# Patient Record
Sex: Male | Born: 1965 | Race: Black or African American | Hispanic: No | Marital: Single | Smoking: Former smoker
Health system: Southern US, Community
[De-identification: ages and names within clinical notes are randomized; demographics above are authoritative.]

---

## 2004-04-30 ENCOUNTER — Emergency Department (HOSPITAL_COMMUNITY): Admission: EM | Admit: 2004-04-30 | Discharge: 2004-04-30 | Payer: Self-pay | Admitting: Emergency Medicine
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
## 2009-05-22 ENCOUNTER — Emergency Department (HOSPITAL_COMMUNITY): Admission: EM | Admit: 2009-05-22 | Discharge: 2009-05-22 | Payer: Self-pay | Admitting: Family Medicine

## 2011-05-07 IMAGING — CR DG CHEST 2V
2 series · 2 of 2 positions shown · non-contrast
Comparison: None

CLINICAL DATA: Chest pain

CHEST - 2 VIEW

[view not recorded (1 of 2)]
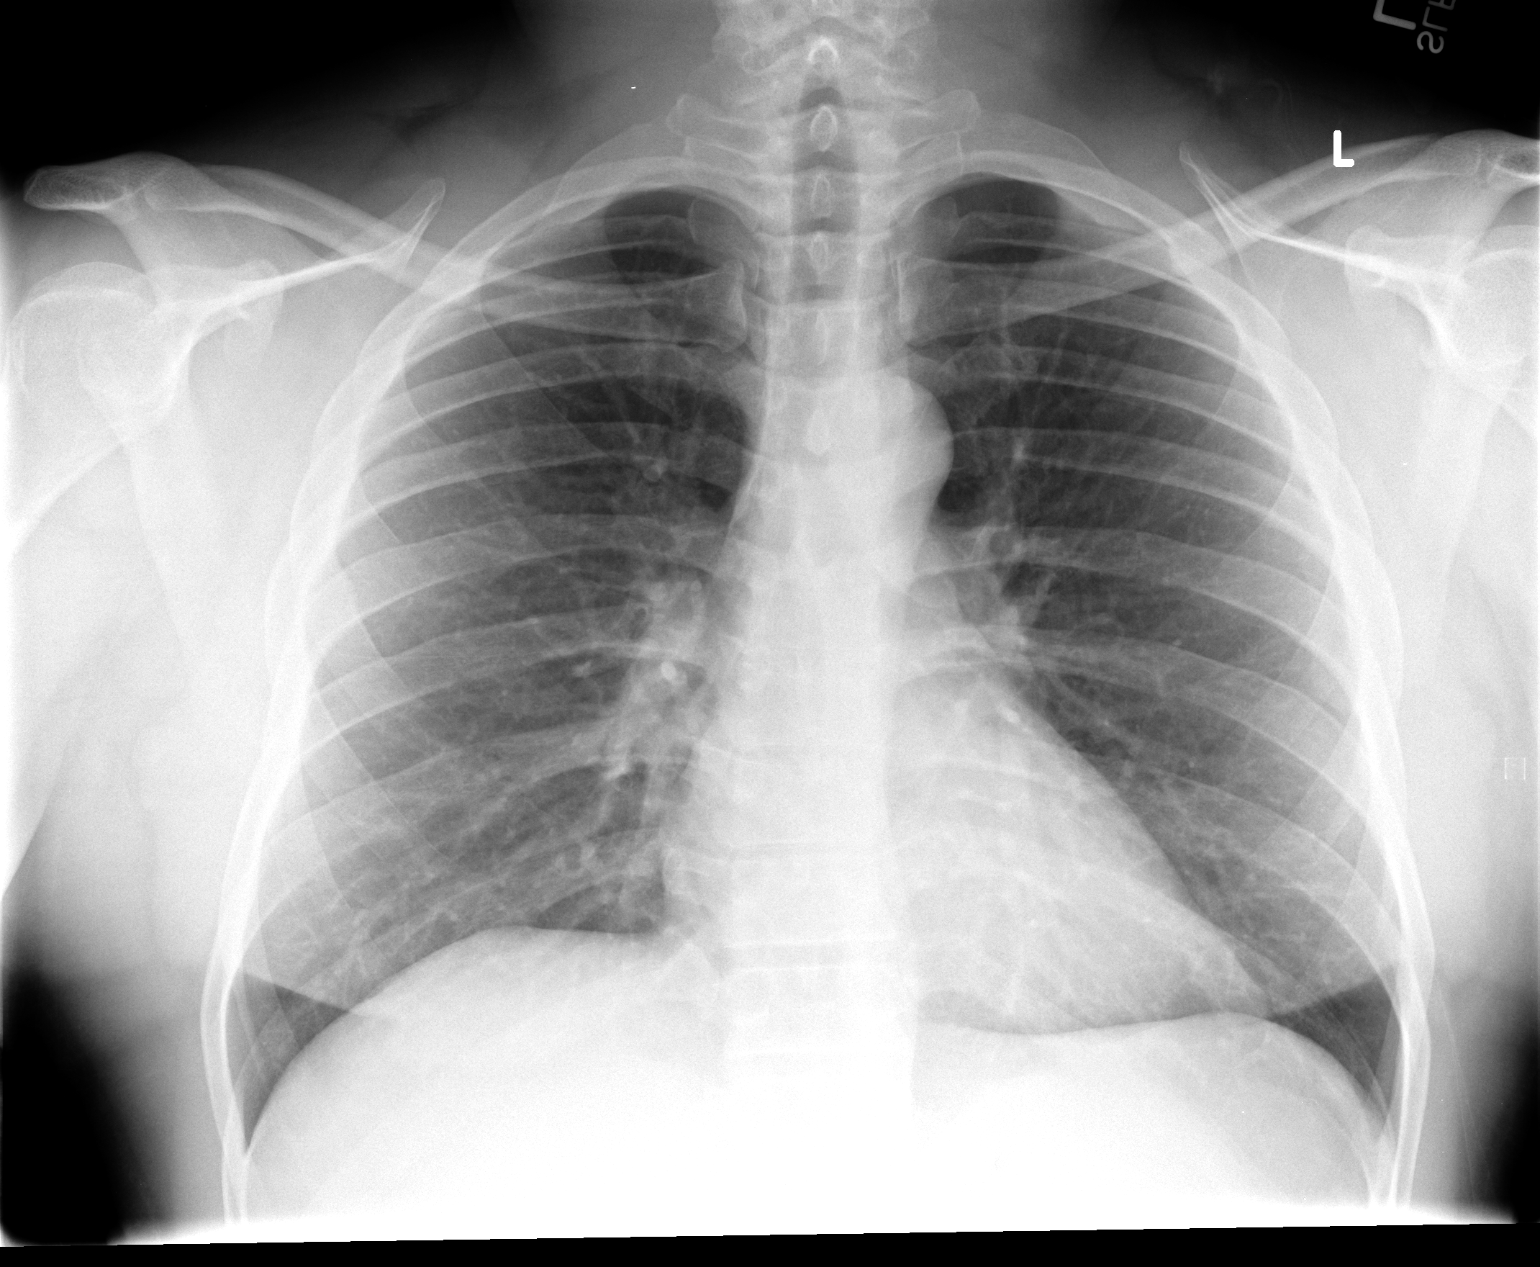

[view not recorded (2 of 2)]
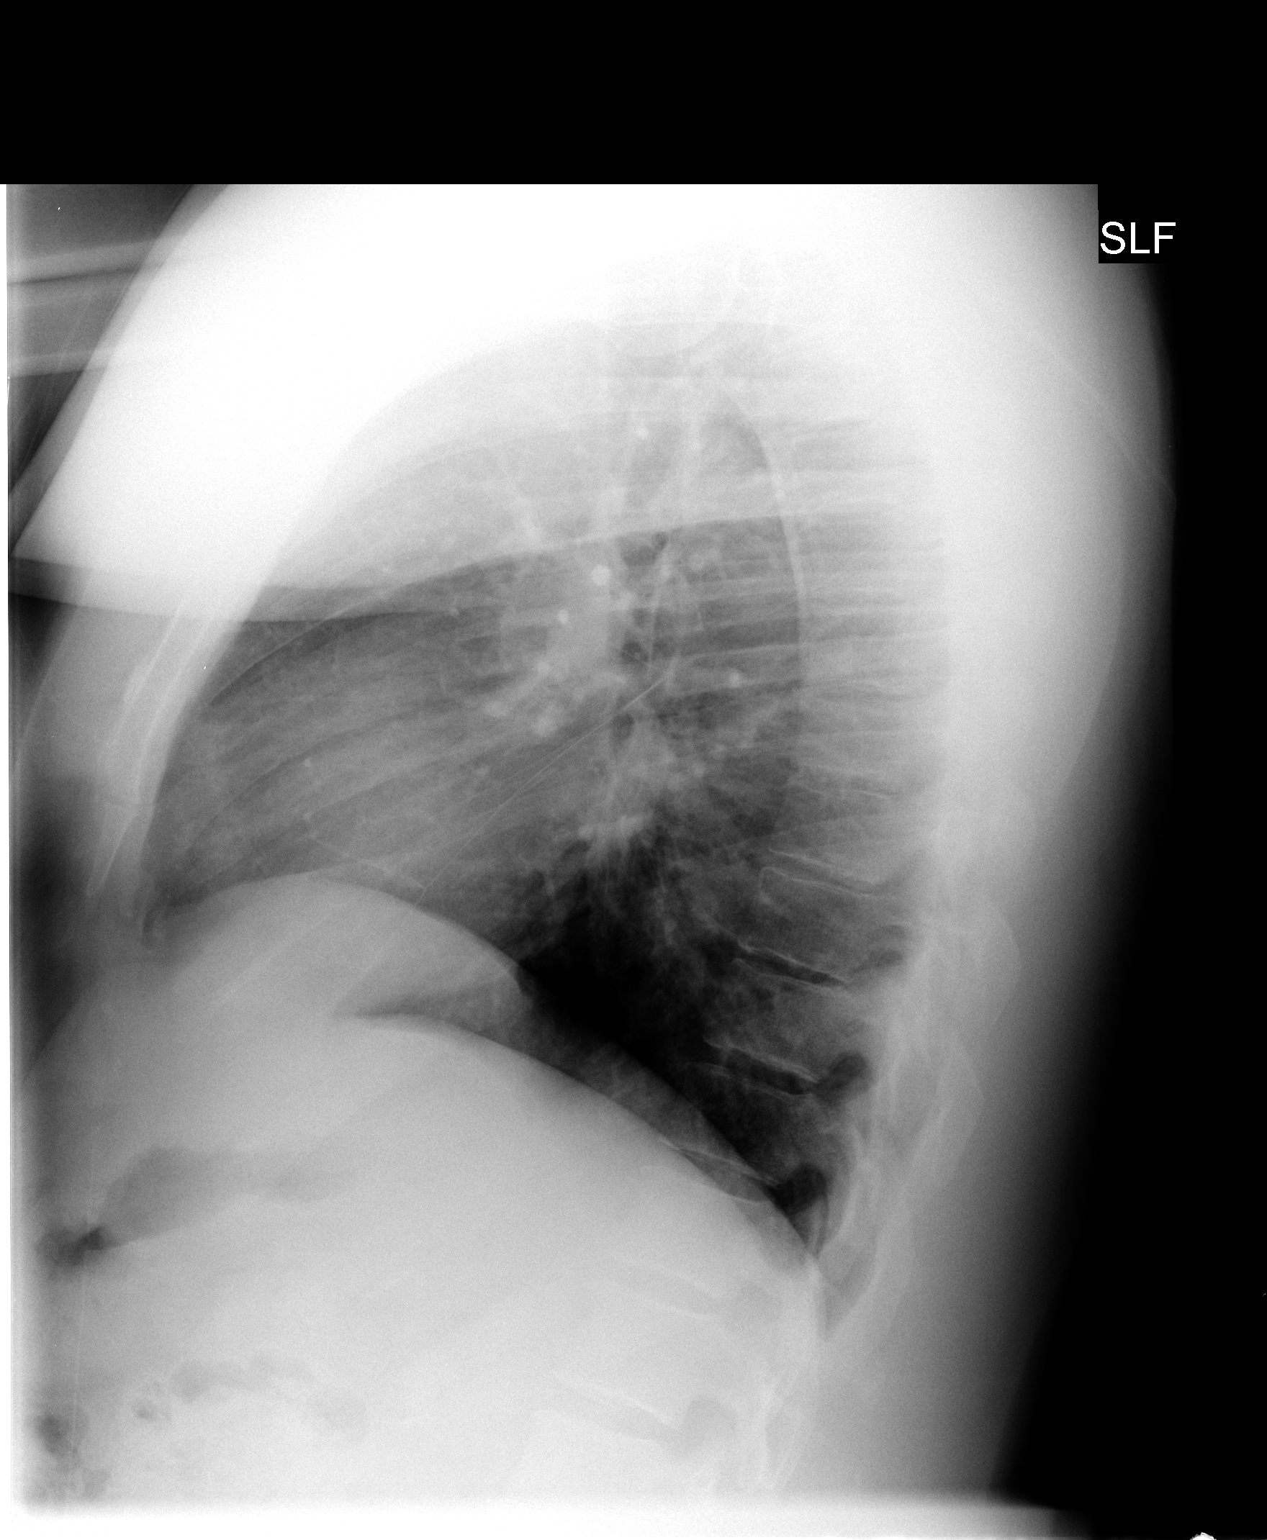

[2 of 2 positions shown; findings below may reference images not displayed]

FINDINGS: Heart size is normal.  The mediastinum is unremarkable.
Lungs are clear.  No effusions.  No bony abnormalities.
IMPRESSION: Normal chest

## 2011-06-02 ENCOUNTER — Ambulatory Visit (INDEPENDENT_AMBULATORY_CARE_PROVIDER_SITE_OTHER): Payer: Managed Care, Other (non HMO) | Admitting: Internal Medicine

## 2011-06-02 VITALS — BP 106/66 | HR 59 | Temp 97.9°F | Resp 16 | Ht 71.5 in | Wt 218.4 lb

## 2011-06-02 DIAGNOSIS — J029 Acute pharyngitis, unspecified: Secondary | ICD-10-CM

## 2011-06-02 LAB — POCT CBC
Granulocyte percent: 32.1 %G — AB (ref 37–80)
HCT, POC: 42.6 % — AB (ref 43.5–53.7)
Hemoglobin: 13.4 g/dL — AB (ref 14.1–18.1)
MPV: 7.5 fL (ref 0–99.8)
POC Granulocyte: 1.1 — AB (ref 2–6.9)
POC MID %: 6.5 %M (ref 0–12)
RBC: 4.9 M/uL (ref 4.69–6.13)

## 2011-06-02 LAB — POCT RAPID STREP A (OFFICE): Rapid Strep A Screen: NEGATIVE

## 2011-06-02 MED ORDER — PREDNISONE 20 MG PO TABS: ORAL_TABLET | ORAL | Status: DC

## 2011-06-02 NOTE — Patient Instructions (Signed)
Pharyngitis:  Take all of prednisone and avoid irritating smoke.  RTC if not improved in 1 week.  Pharyngitis, Viral and Bacterial Pharyngitis is soreness (inflammation) or infection of the pharynx. It is also called a sore throat. CAUSES  Most sore throats are caused by viruses and are part of a cold. However, some sore throats are caused by strep and other bacteria. Sore throats can also be caused by post nasal drip from draining sinuses, allergies and sometimes from sleeping with an open mouth. Infectious sore throats can be spread from person to person by coughing, sneezing and sharing cups or eating utensils. TREATMENT  Sore throats that are viral usually last 3-4 days. Viral illness will get better without medications (antibiotics). Strep throat and other bacterial infections will usually begin to get better about 24-48 hours after you begin to take antibiotics. HOME CARE INSTRUCTIONS   If the caregiver feels there is a bacterial infection or if there is a positive strep test, they will prescribe an antibiotic. The full course of antibiotics must be taken. If the full course of antibiotic is not taken, you or your child may become ill again. If you or your child has strep throat and do not finish all of the medication, serious heart or kidney diseases may develop.   Drink enough water and fluids to keep your urine clear or pale yellow.   Only take over-the-counter or prescription medicines for pain, discomfort or fever as directed by your caregiver.   Get lots of rest.   Gargle with salt water ( tsp. of salt in a glass of water) as often as every 1-2 hours as you need for comfort.   Hard candies may soothe the throat if individual is not at risk for choking. Throat sprays or lozenges may also be used.  SEEK MEDICAL CARE IF:   Large, tender lumps in the neck develop.   A rash develops.   Green, yellow-brown or bloody sputum is coughed up.   Your baby is older than 3 months with a  rectal temperature of 100.5 F (38.1 C) or higher for more than 1 day.  SEEK IMMEDIATE MEDICAL CARE IF:   A stiff neck develops.   You or your child are drooling or unable to swallow liquids.   You or your child are vomiting, unable to keep medications or liquids down.   You or your child has severe pain, unrelieved with recommended medications.   You or your child are having difficulty breathing (not due to stuffy nose).   You or your child are unable to fully open your mouth.   You or your child develop redness, swelling, or severe pain anywhere on the neck.   You have a fever.   Your baby is older than 3 months with a rectal temperature of 102 F (38.9 C) or higher.   Your baby is 14 months old or younger with a rectal temperature of 100.4 F (38 C) or higher.  MAKE SURE YOU:   Understand these instructions.   Will watch your condition.   Will get help right away if you are not doing well or get worse.  Document Released: 03/25/2005 Document Revised: 12/05/2010 Document Reviewed: 06/22/2007 [facility] Patient Information 2012 [place], [place].

## 2011-06-02 NOTE — Progress Notes (Signed)
  Subjective:    Patient ID: Ryan Ho, male    DOB: [birth year], 46 y.o.   MRN: [id number]  Sore Throat  This is a new problem. The current episode started in the past 7 days. The problem has been unchanged. Neither side of throat is experiencing more pain than the other. There has been no fever. Associated symptoms include coughing. Pertinent negatives include no congestion, ear pain or neck pain. He has tried nothing for the symptoms.  Cough This is a new problem. The current episode started in the past 7 days. The problem has been unchanged. The problem occurs every few hours. The cough is productive of sputum. Associated symptoms include a sore throat. Pertinent negatives include no chest pain, chills, ear congestion, ear pain, postnasal drip, rhinorrhea or sweats. He has tried nothing for the symptoms. There is no history of asthma, bronchitis, environmental allergies or pneumonia.   His symptoms began 4 days ago when his respirator at work seemed to malfunction, he worked most of the day with it and had to keep removing it and trying to clear it.  He had a productive cough at that time.  He was given a new respirator later that day and has been able to work everyday.  He denies a past history of lung problems or coughing.   Review of Systems  Constitutional: Negative.  Negative for chills.  HENT: Positive for sore throat. Negative for ear pain, nosebleeds, congestion, rhinorrhea, neck pain and postnasal drip.   Eyes: Negative.   Respiratory: Positive for cough.   Cardiovascular: Negative for chest pain.  Genitourinary: Negative.   Skin: Negative.   Neurological: Negative.   Hematological: Negative for environmental allergies.  Psychiatric/Behavioral: Negative.        Objective:   Physical Exam  Vitals reviewed. Constitutional: He appears well-developed and well-nourished.  HENT:  Head: Normocephalic.  Right Ear: External ear normal.  Left Ear: External ear normal.    Mouth/Throat: No oropharyngeal exudate.  Eyes: Conjunctivae are normal.  Neck: Neck supple.  Cardiovascular: Normal rate, regular rhythm and normal heart sounds.   Pulmonary/Chest: Effort normal. No respiratory distress. He has no wheezes. He has no rales. He exhibits no tenderness.  Lymphadenopathy:    He has no cervical adenopathy.          Assessment & Plan:  Pharyngitis, likely due to irritant inhalation.  He is clinically stable and I do not think a CxR is needed today.  If he doesn't improve after his short course of oral prednisone he is to RTC in 5 days, sooner if worse.  Pt agrees with this plan.

## 2016-04-19 ENCOUNTER — Ambulatory Visit: Payer: BLUE CROSS/BLUE SHIELD | Admitting: Psychology

## 2016-11-01 ENCOUNTER — Ambulatory Visit (INDEPENDENT_AMBULATORY_CARE_PROVIDER_SITE_OTHER): Payer: BLUE CROSS/BLUE SHIELD | Admitting: Family Medicine

## 2016-11-01 ENCOUNTER — Encounter: Payer: Self-pay | Admitting: Family Medicine

## 2016-11-01 VITALS — BP 104/68 | HR 66 | Temp 98.2°F | Resp 16 | Ht 71.0 in | Wt 217.0 lb

## 2016-11-01 DIAGNOSIS — M2142 Flat foot [pes planus] (acquired), left foot: Secondary | ICD-10-CM | POA: Diagnosis not present

## 2016-11-01 DIAGNOSIS — M79672 Pain in left foot: Secondary | ICD-10-CM

## 2016-11-01 DIAGNOSIS — M2010 Hallux valgus (acquired), unspecified foot: Secondary | ICD-10-CM

## 2016-11-01 DIAGNOSIS — M2141 Flat foot [pes planus] (acquired), right foot: Secondary | ICD-10-CM

## 2016-11-01 NOTE — Progress Notes (Signed)
Subjective:  By signing my name below, I, Ryan Ho, attest that this documentation has been prepared under the direction and in the presence of Ryan FloodJeffrey R Zyren Sevigny, MD Electronically Signed: Charline BillsEssence Ho, ED Scribe 11/01/2016 at 11:34 AM.   Patient ID: Ryan Ho, male    DOB: 14-May-1965, 51 y.o.   MRN: [id number]  Chief Complaint  Patient presents with  . Foot Pain    left foot x2 months denies injury   HPI  Ryan CobbMichael Ho is a 51 y.o. male who presents to [facility] Care at [facility] complaining of intermittent left foot pain x 2 months. 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 No known injury, exercise, or new activities. Pt runs 8-9 miles/week on the elliptical and track but states this is unchanged. No change in mild recently. He reports increased left foot pain at the end of the day and with applying pressure to the area . No treatments tried PTA. Pt denies swelling to the area. No h/o gout.  Pain comes and goes in that area of the foot. Right foot has not been hurting. No attempted treatments. He is able to still run.   There are no active problems to display for this patient.  History reviewed. No pertinent past medical history. History reviewed. No pertinent surgical history. No Known Allergies Prior to Admission medications   Medication Sig Start Date End Date Taking? Authorizing Provider  predniSONE (DELTASONE) 20 MG tablet Take three tabs once daily for 2 days, then two tabs daily for two days, then one tab daily for two days. 06/02/11   Rickard PatienceNelson, Ryan E, PA-C   Social History   Social History  . Marital status: Single    Spouse name: N/A  . Number of children: N/A  . Years of education: N/A   Occupational History  . Not on file.   Social History Main Topics  . Smoking status: Former Smoker    Quit date: 06/02/1991  . Smokeless tobacco: Never Used  . Alcohol use No  . Drug use: No  . Sexual activity: Not on file   Other  Topics Concern  . Not on file   Social History Narrative  . No narrative on file   Review of Systems  Musculoskeletal: Positive for arthralgias. Negative for joint swelling.      Objective:   Physical Exam  Constitutional: He is oriented to person, place, and time. He appears well-developed and well-nourished. No distress.  HENT:  Head: Normocephalic and atraumatic.  Eyes: Conjunctivae and EOM are normal.  Neck: Neck supple. No tracheal deviation present.  Cardiovascular: Normal rate.   Pulmonary/Chest: Effort normal. No respiratory distress.  Musculoskeletal: Normal range of motion.  Skin is intact. No erythema. No rash. Pain-free ankle range of motion, no bony tenderness of ankle malleoli. Plantar fascia non-tender. 5th metatarsal is non-tender. Navicular is non-tender. Describes area of discomfort at 1st MTP where there is a sligh bunion/ hallux valgus. Similar appearing area on R foot but not as prominent. Foot assessment: marked foot pes planus bilaterally with collapse of longitudinal arch, slight collapse of transverse arch with outward rotation of 5th toe. Normal inversion on heel lift/toe stance.  valgus appearance of 1st MTP without appreciable splay of toes. Great toe contacts second toe on left with standing. Does not contact on right. Slight tenderness along the sesamoids and metatarsal of great toe on the L. Does have some callus formation over the lateral MTP area  of great toe on the L, without other notable callus  Neurological: He is alert and oriented to person, place, and time.  Skin: Skin is warm and dry.  Psychiatric: He has a normal mood and affect. His behavior is normal.  Nursing note and vitals reviewed.  Vitals:   11/01/16 1055  BP: 104/68  Pulse: 66  Resp: 16  Temp: 98.2 F (36.8 C)  TempSrc: Oral  SpO2: 97%  Weight: 217 lb (98.4 kg)  Height: 5\' 11"  (1.803 m)      Assessment & Plan:  Ryan CobbMichael Ho is a 51 y.o. male Left foot pain - Plan: Ambulatory  referral to Sports Medicine  Pes planus of both feet - Plan: Ambulatory referral to Sports Medicine  Acquired hallux valgus, unspecified laterality - Plan: Ambulatory referral to Sports Medicine  Suspect pes planus/longitudinal arch breakdown with some transverse arch breakdown, hallux valgus deformity left greater than right. May have some component of metatarsalgia, but primarily appears to be at hallux valgus deformity. Currently 8-9 mile per week runner without change in mileage or activity. Doubt gout based on history and intermittent symptoms. May benefit from orthotics, [facility]. Discussed metatarsal cookie, but we are out of stock currently. Can try that over-the-counter temporary for relief if needed.     No orders of the defined types were placed in this encounter.  Patient Instructions    Some of your foot pain is likely due to collapse of the arch were flat feet as well as early bunion. I will refer you to [facility] sports medicine to evaluate your foot further and to consider orthotics. In the meantime you can try picking up a pair of over-the-counter "metatarsal cookies" to take pressure off the sore area.    IF you received an x-ray today, you will receive an invoice from [facility]. Please contact [facility] at [phone] with questions or concerns regarding your invoice.   IF you received labwork today, you will receive an invoice from [facility]. Please contact [facility] at [phone] with questions or concerns regarding your invoice.   Our billing staff will not be able to assist you with questions regarding Ho from these companies.  You will be contacted with the lab results as soon as they are available. The fastest way to get your results is to activate your My Chart account. Instructions are located on the last page of this paperwork. If you have not heard from us regarding the results in 2 weeks, please contact this  office.       I personally performed the services described in this documentation, which was scribed in my presence. The recorded information has been reviewed and considered for accuracy and completeness, addended by me as needed, and agree with information above.  Signed,   Meredith StaggersJeffrey Rayona Sardinha, MD Primary Care at [facility].  11/01/16 12:19 PM

## 2016-11-01 NOTE — Patient Instructions (Addendum)
  Some of your foot pain is likely due to collapse of the arch were flat feet as well as early bunion. I will refer you to [facility] sports [facility] to evaluate your foot further and to consider orthotics. In the meantime you can try picking up a pair of over-the-counter "metatarsal cookies" to take pressure off the sore area.    IF you received an x-ray today, you will receive an invoice from [facility]. Please contact [facility] at [phone] with questions or concerns regarding your invoice.   IF you received labwork today, you will receive an invoice from [facility]. Please contact [facility] at [phone] with questions or concerns regarding your invoice.   Our billing staff will not be able to assist you with questions regarding bills from these companies.  You will be contacted with the lab results as soon as they are available. The fastest way to get your results is to activate your My Chart account. Instructions are located on the last page of this paperwork. If you have not heard from us regarding the results in 2 weeks, please contact this office.

## 2016-11-22 ENCOUNTER — Ambulatory Visit (INDEPENDENT_AMBULATORY_CARE_PROVIDER_SITE_OTHER): Payer: BLUE CROSS/BLUE SHIELD | Admitting: Family Medicine

## 2016-11-22 VITALS — BP 124/82 | Ht 72.0 in | Wt 213.0 lb

## 2016-11-22 DIAGNOSIS — R269 Unspecified abnormalities of gait and mobility: Secondary | ICD-10-CM | POA: Diagnosis not present

## 2016-11-22 DIAGNOSIS — M2021 Hallux rigidus, right foot: Secondary | ICD-10-CM

## 2016-11-22 DIAGNOSIS — M2142 Flat foot [pes planus] (acquired), left foot: Secondary | ICD-10-CM | POA: Diagnosis not present

## 2016-11-22 DIAGNOSIS — M2022 Hallux rigidus, left foot: Secondary | ICD-10-CM | POA: Diagnosis not present

## 2016-11-22 DIAGNOSIS — M2141 Flat foot [pes planus] (acquired), right foot: Secondary | ICD-10-CM | POA: Diagnosis not present

## 2016-11-22 NOTE — Progress Notes (Signed)
Chief complaint: Left foot pain 1 month  History of present illness: Ryan Ho is a 51 year old male who presents to the sports medicine office today with chief complaint of left foot pain. He reports pain over the first MTP of his left foot. He reports no inciting incident, trauma, or injury to explain the pain. He reports a gradual onset of pain over the last month, has been progressively worsening. He does report intermittent pain, especially when he stands for prolonged periods of time. He reports a throbbing pain, no radiation of pain. Does not report of any previous injury to his left foot. 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 He normally wears tennis shoes. He does not report of any warmth, erythema, ecchymosis, or effusion. Does not report of any numbness, tingling, or weakness in his lower extremities. He reports that he does run approximately 10 miles a week. He does not report of any sudden increases in volume or frequency of his training. He was seen by his primary physician a few weeks ago, was diagnosed with hallux rigidus and was referred here for possible orthotics.  Past medical history, surgical history, family history, and social history were reviewed in the chart, and are documented in the chart.  Allergies: No known drug allergies  Review of systems: As stated above  Physical exam: Vital signs reviewed and are documented in chart General: Alert, oriented, appears stated age, in no apparent distress HEENT: Moist oral mucosa Respiratory: Normal respirations, able to speak in full sentences Cardiac: Normal peripheral pulses, regular rate Integumentary: No rashes on visible skin Neurologic: 5/5 in bilateral lower extremities, sensation 2+ in bilateral lower extremities Psychiatric: Appropriate affect and mood Musculoskeletal: Inspection of bilateral feet reveals that he  does have hallux rigidus in both of his feet at the first MTP, he also has pes planus in both of his feet, with collapsed medial arch, specifically with left foot he does not have any tenderness to palpation over the first MTP, Lisfranc joint, base of fifth metatarsal, or navicular, no warmth, erythema, ecchymosis, or effusion noted, he does have full active and passive range of motion with ankle dorsiflexion, plantar flexion, inversion, eversion, has normal toe extension and flexion, on gait evaluation he does have overpronation of both of his ankles, with more of a forefoot striking  Assessment and plan: 1. Hallux rigidus of bilateral first MTP 2. Gait abnormality, with overpronation of both of his ankles 3. Pes planus, bilateral feet  We did discuss custom orthotics today for his feet. He does request additional information regarding what his insurance will cover for custom orthotics. I'm not able to give him this exact information today. Will talk to office manager and get more exact numbers and what his insurance would cover. In the meantime, he will be placed in green insoles with scaphoid padding. This was fitted for him today, felt comfortable to him. Will call him after we get this additional information. Otherwise, he will return on an as-needed basis.   Haynes Kerns, MD Primary Care Sports Medicine Fellow [facility]

## 2020-05-04 ENCOUNTER — Ambulatory Visit (INDEPENDENT_AMBULATORY_CARE_PROVIDER_SITE_OTHER): Payer: Self-pay | Admitting: Otolaryngology

## 2020-05-05 ENCOUNTER — Encounter (INDEPENDENT_AMBULATORY_CARE_PROVIDER_SITE_OTHER): Payer: Self-pay | Admitting: Otolaryngology

## 2020-05-05 ENCOUNTER — Other Ambulatory Visit: Payer: Self-pay

## 2020-05-05 ENCOUNTER — Ambulatory Visit (INDEPENDENT_AMBULATORY_CARE_PROVIDER_SITE_OTHER): Payer: 59 | Admitting: Otolaryngology

## 2020-05-05 VITALS — Temp 96.4°F

## 2020-05-05 DIAGNOSIS — G4733 Obstructive sleep apnea (adult) (pediatric): Secondary | ICD-10-CM | POA: Diagnosis not present

## 2020-05-05 DIAGNOSIS — J310 Chronic rhinitis: Secondary | ICD-10-CM | POA: Diagnosis not present

## 2020-05-05 DIAGNOSIS — J343 Hypertrophy of nasal turbinates: Secondary | ICD-10-CM | POA: Diagnosis not present

## 2020-05-05 DIAGNOSIS — J342 Deviated nasal septum: Secondary | ICD-10-CM | POA: Diagnosis not present

## 2020-05-05 NOTE — Addendum Note (Signed)
Addended by: Dillard Cannon E on: 05/05/2020 03:22 PM   Modules accepted: Level of Service

## 2020-05-05 NOTE — Progress Notes (Addendum)
HPI: Ryan Ho is a 55 y.o. male who presents is referred by Dr. Nicholos Johns for evaluation of nasal obstruction and sleep apnea.  Patient has had a history of sleep apnea for over 6 years..  He has previously used nasal CPAP but does not tolerate this well.  He also complains of chronic nasal obstruction and difficulty breathing through his nose which is worse at night.  He has not been using his nasal CPAP recently.  But he recently slept close to somebody who commented on his sleep stating that he obstructed his breathing at night and had a tendency to toss and turn a lot at night.  He inquires about surgery to help with his nasal breathing. Of note he is put on about 20 pounds over the past 2 years.  His present weight is about 230pounds. He is otherwise healthy.  No past medical history on file. No past surgical history on file. Social History   Socioeconomic History   Marital status: Single    Spouse name: Not on file   Number of children: Not on file   Years of education: Not on file   Highest education level: Not on file  Occupational History   Not on file  Tobacco Use   Smoking status: Former Smoker    Quit date: 06/02/1991    Years since quitting: 28.9   Smokeless tobacco: Never Used  Vaping Use   Vaping Use: Never used  Substance and Sexual Activity   Alcohol use: No   Drug use: No   Sexual activity: Not on file  Other Topics Concern   Not on file  Social History Narrative   Not on file   Social Determinants of Health   Financial Resource Strain: Not on file  Food Insecurity: Not on file  Transportation Needs: Not on file  Physical Activity: Not on file  Stress: Not on file  Social Connections: Not on file   No family history on file. No Known Allergies Prior to Admission medications   Not on File     Positive ROS: Otherwise negative  All other systems have been reviewed and were otherwise negative with the exception of those mentioned in  the HPI and as above.  Physical Exam: Constitutional: Alert, well-appearing, no acute distress Ears: External ears without lesions or tenderness. Ear canals are clear bilaterally with intact, clear TMs.  Nasal: External nose without lesions. Septum is deviated to the left with more nasal obstruction on the left side.  He has moderate turbinate hypertrophy.  Mild rhinitis.  No polyps.  Both middle meatus regions are clear.. Clear nasal passages otherwise. Oral: Lips and gums without lesions. Tongue and palate mucosa without lesions. Posterior oropharynx clear.  Moderate size 2+ tonsils.  He has a large tongue base.  He has a small papilloma on the left side of the uvula. Neck: No palpable adenopathy or masses Cardiac exam: Regular rate and rhythm without murmur. Respiratory: Breathing comfortably  Skin: No facial/neck lesions or rash noted.  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 Obstructive sleep apnea.  Plan: Reviewed with patient concerning septoplasty and turbinate reductions to help with the nasal breathing however he would still have significant sleep apnea.  The only successful treatment for sleep apnea would be use of nasal CPAP machine and discussed this with him.  Also discussed surgical options for sleep apnea which would include tonsillectomy and palatoplasty.  If he had the surgery and lost 30  pounds he would probably improve the majority of his sleep apnea. I presently discussed with him concerning use of nasal steroid spray and prescribed Nasacort 2 sprays each nostril at night as this should help some with his breathing at night.  Also discussed with him concerning trying to lose some weight which he will attempt. He will call us back if he decides to pursue any surgical options.   Narda Bonds, MD   CC:

## 2022-08-20 ENCOUNTER — Other Ambulatory Visit (HOSPITAL_COMMUNITY): Payer: Self-pay

## 2022-08-20 DIAGNOSIS — E785 Hyperlipidemia, unspecified: Secondary | ICD-10-CM

## 2022-09-24 ENCOUNTER — Ambulatory Visit (HOSPITAL_COMMUNITY): Payer: Commercial Managed Care - HMO

## 2022-09-24 ENCOUNTER — Ambulatory Visit (HOSPITAL_COMMUNITY)
Admission: RE | Admit: 2022-09-24 | Discharge: 2022-09-24 | Disposition: A | Discharge status: Home / Self Care | Payer: Commercial Managed Care - HMO | Source: Ambulatory Visit

## 2022-09-24 ENCOUNTER — Encounter (HOSPITAL_COMMUNITY): Payer: Self-pay

## 2022-09-24 DIAGNOSIS — E785 Hyperlipidemia, unspecified: Secondary | ICD-10-CM | POA: Insufficient documentation

## 2023-12-22 ENCOUNTER — Ambulatory Visit (INDEPENDENT_AMBULATORY_CARE_PROVIDER_SITE_OTHER): Admitting: Nurse Practitioner

## 2023-12-22 ENCOUNTER — Encounter: Payer: Self-pay | Admitting: Nurse Practitioner

## 2023-12-22 VITALS — BP 122/74 | HR 73 | Ht 73.0 in | Wt 259.0 lb

## 2023-12-22 DIAGNOSIS — G4733 Obstructive sleep apnea (adult) (pediatric): Secondary | ICD-10-CM | POA: Diagnosis not present

## 2023-12-22 DIAGNOSIS — Z87891 Personal history of nicotine dependence: Secondary | ICD-10-CM | POA: Diagnosis not present

## 2023-12-22 DIAGNOSIS — R0683 Snoring: Secondary | ICD-10-CM

## 2023-12-22 NOTE — Patient Instructions (Addendum)
 Continue to use CPAP every night, minimum of 4-6 hours a night.  Change equipment as directed. Wash your tubing with warm soap and water daily, hang to dry. Wash humidifier portion weekly. Use bottled, distilled water and change daily Be aware of reduced alertness and do not drive or operate heavy machinery if experiencing this or drowsiness.  Exercise encouraged, as tolerated. Healthy weight management discussed.  Avoid or decrease alcohol consumption and medications that make you more sleepy, if possible. Notify if persistent daytime sleepiness occurs even with consistent use of PAP therapy.  Change CPAP supplies... Every month Mask cushions and/or nasal pillows CPAP machine filters Every 3 months Mask frame (not including the headgear) CPAP tubing Every 6 months Mask headgear Chin strap (if applicable) Humidifier water tub   Given your symptoms and history, I am concerned that you still have sleep disordered breathing with sleep apnea. You will need a repeat sleep study for further evaluation. Someone will contact you to schedule this. Do not use your CPAP the night of the study   We discussed how untreated sleep apnea puts an individual at risk for cardiac arrhthymias, pulm HTN, DM, stroke and increases their risk for daytime accidents.   Follow up in 10-12 weeks with Katie Claudett Bayly,NP to go over sleep study results, or sooner, if needed. Friday PM virtual clinic preferred

## 2023-12-22 NOTE — Progress Notes (Signed)
 @Patient  ID: Ryan Ho, male    DOB: 10/04/1965, 58 y.o.   MRN: [id number]  Chief Complaint  Patient presents with   Consult    Pt states needs new cpap / 58 years old     Referring provider: No ref. provider found  HPI: 58 year old male, former smoker referred for sleep consult. Past medical history significant for OSA.  TEST/EVENTS:   12/22/2023: Today - sleep consult Discussed the use of AI scribe software for clinical note transcription with the patient, who gave verbal consent to proceed.  History of Present Illness Ryan Ho is a 58 year old male with sleep apnea who presents for evaluation of his CPAP machine and consideration of a repeat sleep study.   He has a history of sleep apnea and uses a CPAP machine at home. The machine is old and requires a makeshift setup with two hoses. He did not bring the CPAP machine or its SD card to the appointment.  He reports some weight gain since his last sleep study. He is a [employer] with a [employer] license.   No issues with daytime fatigue when using his CPAP, and the CPAP has helped reduce daytime tiredness. No drowsy driving is experienced, and he does not take any sleep medications. He believes he snores when not using the CPAP but denies waking up choking or gasping for air.  He inquires about alternative CPAP mask attachments due to discomfort and marks on his face from the current setup. He currently uses a nasal pillow mask.  He goes to bed around 1130 pm. Falls asleep within 10 minutes. Sometimes wakes up once a night. Gets up around 8am. Last sleep study was 7 years ago; prior record not available. Unsure of exact CPAP settings.   Epworth 0     No Known Allergies   There is no immunization history on file for this patient.  No past medical history on file.  Tobacco History: Social History   Tobacco Use  Smoking Status Former   Current packs/day: 0.00   Types: Cigarettes   Quit date: 06/02/1991   Years  since quitting: 32.5  Smokeless Tobacco Never   Counseling given: Not Answered   No outpatient medications prior to visit.   No facility-administered medications prior to visit.     Review of Systems: As above    Physical Exam:  BP 122/74   Pulse 73   Ht 6' 1 (1.854 m)   Wt 259 lb (117.5 kg)   SpO2 99%   BMI 34.17 kg/m   GEN: Pleasant, interactive, well-appearing; obese; in no acute distress HEENT:  Normocephalic and atraumatic. PERRLA. Sclera white. Nasal turbinates pink, moist and patent bilaterally. No rhinorrhea present. Oropharynx pink and moist, without exudate or edema. No lesions, ulcerations, or postnasal drip. Mallampati IV NECK:  Supple w/ fair ROM.Thyroid symmetrical with no goiter or nodules palpated. No lymphadenopathy.   CV: RRR, no m/r/g PULMONARY:  Unlabored, regular breathing. Clear bilaterally A&P w/o wheezes/rales/rhonchi. No accessory muscle use.  GI: BS present and normoactive. Soft, non-tender to palpation.  MSK: No erythema, warmth or tenderness. Cap refil <2 sec all extrem.  Neuro: A/Ox3. No focal deficits noted.   Skin: Warm, no lesions or rashe Psych: Normal affect and behavior. Judgement and thought content appropriate.     Lab Results:  CBC    Component Value Date/Time   WBC 3.5 (A) 06/02/2011 1653   RBC 4.90 06/02/2011 1653   HGB 13.4 (  A) 06/02/2011 1653   HCT 42.6 (A) 06/02/2011 1653   MCV 87.0 06/02/2011 1653   MCH 27.3 06/02/2011 1653   MCHC 31.5 (A) 06/02/2011 1653    BMET No results found for: NA, K, CL, CO2, GLUCOSE, BUN, CREATININE, CALCIUM, GFRNONAA, GFRAA  BNP No results found for: BNP   Imaging:  No results found.  Administration History     None           No data to display          No results found for: NITRICOXIDE      Assessment & Plan:   OSA on CPAP OSA on CPAP. Prior records unavailable and no download available. CPAP is very old. He will need updated sleep  study to reassess severity and determine necessity of continued therapy. He is willing to continue CPAP therapy, if indicated. Receives benefit from use. Reviewed risks of untreated OSA. Healthy weight loss encouraged. Safe driving practices reviewed.   Patient Instructions  Continue to use CPAP every night, minimum of 4-6 hours a night.  Change equipment as directed. Wash your tubing with warm soap and water daily, hang to dry. Wash humidifier portion weekly. Use bottled, distilled water and change daily Be aware of reduced alertness and do not drive or operate heavy machinery if experiencing this or drowsiness.  Exercise encouraged, as tolerated. Healthy weight management discussed.  Avoid or decrease alcohol consumption and medications that make you more sleepy, if possible. Notify if persistent daytime sleepiness occurs even with consistent use of PAP therapy.  Change CPAP supplies... Every month Mask cushions and/or nasal pillows CPAP machine filters Every 3 months Mask frame (not including the headgear) CPAP tubing Every 6 months Mask headgear Chin strap (if applicable) Humidifier water tub   Given your symptoms and history, I am concerned that you still have sleep disordered breathing with sleep apnea. You will need a repeat sleep study for further evaluation. Someone will contact you to schedule this. Do not use your CPAP the night of the study   We discussed how untreated sleep apnea puts an individual at risk for cardiac arrhthymias, pulm HTN, DM, stroke and increases their risk for daytime accidents.   Follow up in 10-12 weeks with Katie Briley Bumgarner,NP to go over sleep study results, or sooner, if needed. Friday PM virtual clinic preferred      Advised if symptoms do not improve or worsen, to please contact office for sooner follow up or seek emergency care.   I spent 35 minutes of dedicated to the care of this patient on the date of this encounter to include pre-visit review  of records, face-to-face time with the patient discussing conditions above, post visit ordering of testing, clinical documentation with the electronic health record, making appropriate referrals as documented, and communicating necessary findings to members of the patients care team.  Comer LULLA Rouleau, NP 12/22/2023  Pt aware and understands NP's role.

## 2023-12-22 NOTE — Assessment & Plan Note (Signed)
 OSA on CPAP. Prior records unavailable and no download available. CPAP is very old. He will need updated sleep study to reassess severity and determine necessity of continued therapy. He is willing to continue CPAP therapy, if indicated. Receives benefit from use. Reviewed risks of untreated OSA. Healthy weight loss encouraged. Safe driving practices reviewed.   Patient Instructions  Continue to use CPAP every night, minimum of 4-6 hours a night.  Change equipment as directed. Wash your tubing with warm soap and water daily, hang to dry. Wash humidifier portion weekly. Use bottled, distilled water and change daily Be aware of reduced alertness and do not drive or operate heavy machinery if experiencing this or drowsiness.  Exercise encouraged, as tolerated. Healthy weight management discussed.  Avoid or decrease alcohol consumption and medications that make you more sleepy, if possible. Notify if persistent daytime sleepiness occurs even with consistent use of PAP therapy.  Change CPAP supplies... Every month Mask cushions and/or nasal pillows CPAP machine filters Every 3 months Mask frame (not including the headgear) CPAP tubing Every 6 months Mask headgear Chin strap (if applicable) Humidifier water tub   Given your symptoms and history, I am concerned that you still have sleep disordered breathing with sleep apnea. You will need a repeat sleep study for further evaluation. Someone will contact you to schedule this. Do not use your CPAP the night of the study   We discussed how untreated sleep apnea puts an individual at risk for cardiac arrhthymias, pulm HTN, DM, stroke and increases their risk for daytime accidents.   Follow up in 10-12 weeks with Katie Mohamud Mrozek,NP to go over sleep study results, or sooner, if needed. Friday PM virtual clinic preferred

## 2023-12-30 ENCOUNTER — Ambulatory Visit

## 2024-01-02 ENCOUNTER — Telehealth (HOSPITAL_BASED_OUTPATIENT_CLINIC_OR_DEPARTMENT_OTHER): Payer: Self-pay

## 2024-01-02 NOTE — Telephone Encounter (Signed)
 Copied from CRM [id number]. Topic: General - Call Back - No Documentation >> Jan 02, 2024 11:14 AM Leila C wrote: Reason for CRM: Patient [phone] is returning Kristen's call from NP, Cobb, unsure of the reason of the call maybe it's an appointment? Per CAL, [facility] is not able and send crm to clinical. Patient call was lost, called patient back. Patient verbalized  understanding. Please call back.

## 2024-01-02 NOTE — Telephone Encounter (Signed)
 Patient has been scheduled nfn.

## 2024-01-29 ENCOUNTER — Encounter

## 2024-02-17 ENCOUNTER — Ambulatory Visit

## 2024-02-17 DIAGNOSIS — R0683 Snoring: Secondary | ICD-10-CM

## 2024-02-17 DIAGNOSIS — G4733 Obstructive sleep apnea (adult) (pediatric): Secondary | ICD-10-CM

## 2024-03-17 ENCOUNTER — Ambulatory Visit

## 2024-03-22 DIAGNOSIS — G4733 Obstructive sleep apnea (adult) (pediatric): Secondary | ICD-10-CM | POA: Diagnosis not present

## 2024-04-13 ENCOUNTER — Ambulatory Visit: Payer: Self-pay | Admitting: Nurse Practitioner

## 2024-04-13 DIAGNOSIS — G4733 Obstructive sleep apnea (adult) (pediatric): Secondary | ICD-10-CM

## 2024-05-05 ENCOUNTER — Telehealth: Payer: Self-pay

## 2024-05-05 NOTE — Telephone Encounter (Signed)
 Faxed signed CMN to [facility] at [phone] Fax confirmation received, NFN.

## 2024-06-15 ENCOUNTER — Encounter: Admitting: Pulmonary Disease
